# Patient Record
Sex: Male | Born: 1965 | Race: White | Hispanic: No
Health system: Southern US, Community
[De-identification: ages and names within clinical notes are randomized; demographics above are authoritative.]

## PROBLEM LIST (undated history)

## (undated) HISTORY — PX: LITHOTRIPSY: SUR834

---

## 2004-11-01 ENCOUNTER — Ambulatory Visit: Payer: Self-pay | Admitting: Internal Medicine

## 2004-12-13 ENCOUNTER — Ambulatory Visit: Payer: Self-pay | Admitting: Internal Medicine

## 2015-12-04 ENCOUNTER — Emergency Department (HOSPITAL_COMMUNITY): Payer: Medicaid Other

## 2015-12-04 ENCOUNTER — Observation Stay (HOSPITAL_COMMUNITY)
Admission: EM | Admit: 2015-12-04 | Discharge: 2015-12-06 | Disposition: A | Discharge status: Home / Self Care | Payer: Medicaid Other | Attending: Surgery | Admitting: Surgery

## 2015-12-04 ENCOUNTER — Encounter (HOSPITAL_COMMUNITY): Payer: Self-pay | Admitting: Family Medicine

## 2015-12-04 DIAGNOSIS — K8000 Calculus of gallbladder with acute cholecystitis without obstruction: Principal | ICD-10-CM | POA: Insufficient documentation

## 2015-12-04 DIAGNOSIS — Z87442 Personal history of urinary calculi: Secondary | ICD-10-CM | POA: Diagnosis not present

## 2015-12-04 DIAGNOSIS — R1084 Generalized abdominal pain: Secondary | ICD-10-CM | POA: Diagnosis present

## 2015-12-04 DIAGNOSIS — R739 Hyperglycemia, unspecified: Secondary | ICD-10-CM | POA: Diagnosis not present

## 2015-12-04 DIAGNOSIS — F17200 Nicotine dependence, unspecified, uncomplicated: Secondary | ICD-10-CM | POA: Diagnosis not present

## 2015-12-04 DIAGNOSIS — K8010 Calculus of gallbladder with chronic cholecystitis without obstruction: Secondary | ICD-10-CM

## 2015-12-04 DIAGNOSIS — D729 Disorder of white blood cells, unspecified: Secondary | ICD-10-CM

## 2015-12-04 DIAGNOSIS — R109 Unspecified abdominal pain: Secondary | ICD-10-CM

## 2015-12-04 DIAGNOSIS — N200 Calculus of kidney: Secondary | ICD-10-CM

## 2015-12-04 DIAGNOSIS — R112 Nausea with vomiting, unspecified: Secondary | ICD-10-CM

## 2015-12-04 DIAGNOSIS — N400 Enlarged prostate without lower urinary tract symptoms: Secondary | ICD-10-CM | POA: Insufficient documentation

## 2015-12-04 LAB — URINALYSIS, ROUTINE W REFLEX MICROSCOPIC
BILIRUBIN URINE: NEGATIVE
GLUCOSE, UA: NEGATIVE mg/dL
Hgb urine dipstick: NEGATIVE
KETONES UR: 15 mg/dL — AB
Leukocytes, UA: NEGATIVE
NITRITE: NEGATIVE
PH: 6.5 (ref 5.0–8.0)
Protein, ur: NEGATIVE mg/dL
Specific Gravity, Urine: 1.01 (ref 1.005–1.030)

## 2015-12-04 LAB — DIFFERENTIAL
BASOS ABS: 0 10*3/uL (ref 0.0–0.1)
BASOS PCT: 0 %
EOS ABS: 0 10*3/uL (ref 0.0–0.7)
Eosinophils Relative: 0 %
Lymphocytes Relative: 8 %
Lymphs Abs: 1.1 10*3/uL (ref 0.7–4.0)
MONO ABS: 0.3 10*3/uL (ref 0.1–1.0)
MONOS PCT: 2 %
Neutro Abs: 12.6 10*3/uL — ABNORMAL HIGH (ref 1.7–7.7)
Neutrophils Relative %: 90 %

## 2015-12-04 LAB — COMPREHENSIVE METABOLIC PANEL
ALBUMIN: 3.9 g/dL (ref 3.5–5.0)
ALK PHOS: 89 U/L (ref 38–126)
ALT: 13 U/L — AB (ref 17–63)
AST: 21 U/L (ref 15–41)
Anion gap: 11 (ref 5–15)
BILIRUBIN TOTAL: 0.3 mg/dL (ref 0.3–1.2)
BUN: 7 mg/dL (ref 6–20)
CO2: 23 mmol/L (ref 22–32)
CREATININE: 0.66 mg/dL (ref 0.61–1.24)
Calcium: 9 mg/dL (ref 8.9–10.3)
Chloride: 106 mmol/L (ref 101–111)
GFR calc Af Amer: >60 mL/min (ref >60)
GFR calc non Af Amer: >60 mL/min (ref >60)
GLUCOSE: 172 mg/dL — AB (ref 65–99)
POTASSIUM: 4.1 mmol/L (ref 3.5–5.1)
Sodium: 140 mmol/L (ref 135–145)
TOTAL PROTEIN: 6.6 g/dL (ref 6.5–8.1)

## 2015-12-04 LAB — CBC
HEMATOCRIT: 49.9 % (ref 39.0–52.0)
Hemoglobin: 16.8 g/dL (ref 13.0–17.0)
MCH: 31.2 pg (ref 26.0–34.0)
MCHC: 33.7 g/dL (ref 30.0–36.0)
MCV: 92.8 fL (ref 78.0–100.0)
Platelets: 197 10*3/uL (ref 150–400)
RBC: 5.38 MIL/uL (ref 4.22–5.81)
RDW: 12.4 % (ref 11.5–15.5)
WBC: 14.3 10*3/uL — ABNORMAL HIGH (ref 4.0–10.5)

## 2015-12-04 LAB — SURGICAL PCR SCREEN
MRSA, PCR: NEGATIVE
Staphylococcus aureus: NEGATIVE

## 2015-12-04 LAB — LIPASE, BLOOD: Lipase: 21 U/L (ref 11–51)

## 2015-12-04 MED ORDER — DIPHENHYDRAMINE HCL 25 MG PO CAPS: 25.0000 mg | ORAL_CAPSULE | Freq: Four times a day (QID) | ORAL | Status: DC | PRN

## 2015-12-04 MED ORDER — OXYCODONE-ACETAMINOPHEN 5-325 MG PO TABS
1.0000 | ORAL_TABLET | ORAL | Status: DC | PRN
  Administered 2015-12-05 – 2015-12-06 (×3): 2 via ORAL
  Filled 2015-12-04 (×3): qty 2

## 2015-12-04 MED ORDER — FENTANYL CITRATE (PF) 100 MCG/2ML IJ SOLN
INTRAMUSCULAR | Status: AC
  Filled 2015-12-04: qty 2

## 2015-12-04 MED ORDER — HYDROMORPHONE HCL 1 MG/ML IJ SOLN
1.0000 mg | Freq: Once | INTRAMUSCULAR | Status: AC
  Administered 2015-12-04: 1 mg via INTRAVENOUS
  Filled 2015-12-04: qty 1

## 2015-12-04 MED ORDER — ONDANSETRON 4 MG PO TBDP: 4.0000 mg | ORAL_TABLET | Freq: Four times a day (QID) | ORAL | Status: DC | PRN

## 2015-12-04 MED ORDER — ACETAMINOPHEN 650 MG RE SUPP: 650.0000 mg | Freq: Four times a day (QID) | RECTAL | Status: DC | PRN

## 2015-12-04 MED ORDER — ONDANSETRON HCL 4 MG/2ML IJ SOLN: 4.0000 mg | Freq: Four times a day (QID) | INTRAMUSCULAR | Status: DC | PRN

## 2015-12-04 MED ORDER — NICOTINE 14 MG/24HR TD PT24
14.0000 mg | MEDICATED_PATCH | Freq: Every day | TRANSDERMAL | Status: DC
  Administered 2015-12-06: 14 mg via TRANSDERMAL
  Filled 2015-12-04 (×2): qty 1

## 2015-12-04 MED ORDER — FENTANYL CITRATE (PF) 100 MCG/2ML IJ SOLN
50.0000 ug | Freq: Once | INTRAMUSCULAR | Status: AC
Start: 2015-12-04 — End: 2015-12-04
  Administered 2015-12-04: 50 ug via INTRAVENOUS

## 2015-12-04 MED ORDER — ACETAMINOPHEN 325 MG PO TABS: 650.0000 mg | ORAL_TABLET | Freq: Four times a day (QID) | ORAL | Status: DC | PRN

## 2015-12-04 MED ORDER — DEXTROSE 5 % IV SOLN
2.0000 g | Freq: Once | INTRAVENOUS | Status: DC
  Filled 2015-12-04: qty 2

## 2015-12-04 MED ORDER — ONDANSETRON 4 MG PO TBDP
ORAL_TABLET | ORAL | Status: AC
  Filled 2015-12-04: qty 1

## 2015-12-04 MED ORDER — DEXTROSE 5 % IV SOLN
2.0000 g | INTRAVENOUS | Status: DC
  Administered 2015-12-04 – 2015-12-05 (×2): 2 g via INTRAVENOUS
  Filled 2015-12-04 (×2): qty 2

## 2015-12-04 MED ORDER — DEXTROSE-NACL 5-0.9 % IV SOLN
INTRAVENOUS | Status: DC
  Administered 2015-12-04 – 2015-12-05 (×2): via INTRAVENOUS

## 2015-12-04 MED ORDER — OXYCODONE-ACETAMINOPHEN 5-325 MG PO TABS
1.0000 | ORAL_TABLET | Freq: Once | ORAL | Status: DC
Start: 2015-12-04 — End: 2015-12-04

## 2015-12-04 MED ORDER — IOHEXOL 300 MG/ML  SOLN
100.0000 mL | Freq: Once | INTRAMUSCULAR | Status: AC | PRN
  Administered 2015-12-04: 100 mL via INTRAVENOUS

## 2015-12-04 MED ORDER — MORPHINE SULFATE (PF) 2 MG/ML IV SOLN
4.0000 mg | Freq: Once | INTRAVENOUS | Status: AC
  Administered 2015-12-04: 4 mg via INTRAVENOUS
  Filled 2015-12-04: qty 2

## 2015-12-04 MED ORDER — ONDANSETRON 4 MG PO TBDP
4.0000 mg | ORAL_TABLET | Freq: Once | ORAL | Status: AC | PRN
  Administered 2015-12-04: 4 mg via ORAL

## 2015-12-04 MED ORDER — SODIUM CHLORIDE 0.9 % IV BOLUS (SEPSIS)
1000.0000 mL | Freq: Once | INTRAVENOUS | Status: AC
  Administered 2015-12-04: 1000 mL via INTRAVENOUS

## 2015-12-04 MED ORDER — ENOXAPARIN SODIUM 40 MG/0.4ML ~~LOC~~ SOLN
40.0000 mg | SUBCUTANEOUS | Status: DC
  Administered 2015-12-04 – 2015-12-05 (×2): 40 mg via SUBCUTANEOUS
  Filled 2015-12-04 (×2): qty 0.4

## 2015-12-04 MED ORDER — METOPROLOL TARTRATE 1 MG/ML IV SOLN: 5.0000 mg | Freq: Four times a day (QID) | INTRAVENOUS | Status: DC | PRN

## 2015-12-04 MED ORDER — PROMETHAZINE HCL 25 MG/ML IJ SOLN
25.0000 mg | Freq: Once | INTRAMUSCULAR | Status: AC
  Administered 2015-12-04: 25 mg via INTRAVENOUS
  Filled 2015-12-04: qty 1

## 2015-12-04 MED ORDER — DIPHENHYDRAMINE HCL 50 MG/ML IJ SOLN: 25.0000 mg | Freq: Four times a day (QID) | INTRAMUSCULAR | Status: DC | PRN

## 2015-12-04 MED ORDER — FENTANYL CITRATE (PF) 100 MCG/2ML IJ SOLN
50.0000 ug | Freq: Once | INTRAMUSCULAR | Status: AC
  Administered 2015-12-04: 50 ug via INTRAVENOUS

## 2015-12-04 MED ORDER — ONDANSETRON HCL 4 MG/2ML IJ SOLN
4.0000 mg | Freq: Once | INTRAMUSCULAR | Status: AC | PRN
  Administered 2015-12-04: 4 mg via INTRAVENOUS
  Filled 2015-12-04: qty 2

## 2015-12-04 MED ORDER — HYDROMORPHONE HCL 1 MG/ML IJ SOLN
1.0000 mg | INTRAMUSCULAR | Status: DC | PRN
  Administered 2015-12-04 – 2015-12-05 (×6): 1 mg via INTRAVENOUS
  Filled 2015-12-04 (×6): qty 1

## 2015-12-04 NOTE — ED Notes (Signed)
Attempted report 

## 2015-12-04 NOTE — ED Provider Notes (Signed)
CSN: [id number]     Arrival date & time 12/04/15  1045 History   First MD Initiated Contact with Patient 12/04/15 1339     Chief Complaint  Patient presents with  . Abdominal Pain     (Consider location/radiation/quality/duration/timing/severity/associated sxs/prior Treatment) HPI Comments: Dennis Waters is a 50 y.o. male with a PMHx of nephrolithiasis s/p lithotripsy, BPH, and cholelithiasis seen on CT imaging at [facility] last year, who presents to the ED with complaints of sudden onset gradually worsening abdominal pain that began around midnight last night approximately 13 hours prior to evaluation. He states the abdominal pain started somewhat in the periumbilical area/more generalized, but then localized to the right lower and upper quadrants. He describes the pain is 9/10 constant stabbing in the right lower and upper quadrants radiating into the left lower quadrant, worse with sitting, and unrelieved with fentanyl and  morphine given in triage. Associated symptoms include nausea, 4 episodes of nonbloody nonbilious emesis, and belching. He denies any fevers, chills, chest pain, shortness breath, melena, hematochezia, diarrhea, constipation, obstipation, hematemesis, dysuria, hematuria, flank pain, numbness, tingling, or focal weakness. He denies any recent travel, sick contacts, suspicious food intake, alcohol use, chronic NSAID use, or prior abdominal surgeries. His care for the prior kidney stones was at [facility] ER. States this doesn't feel like his prior kidney stones.   Patient is a 50 y.o. male presenting with abdominal pain. The history is provided by the patient. No language interpreter was used.  Abdominal Pain Pain location:  RLQ and RUQ (initially periumbilical/generalized, then localized to RLQ/RUQ) Pain quality: stabbing   Pain radiates to:  LLQ Pain severity:  Severe Onset quality:  Sudden Duration:  13 hours Timing:  Constant Progression:   Worsening Chronicity:  New Context: not previous surgeries, not recent travel, not sick contacts and not suspicious food intake   Relieved by:  Nothing Exacerbated by: sitting. Ineffective treatments: fentanyl, morphine, and zofran. Associated symptoms: belching, nausea and vomiting   Associated symptoms: no chest pain, no chills, no constipation, no diarrhea, no dysuria, no fever, no flatus, no hematemesis, no hematuria, no melena and no shortness of breath   Risk factors: no alcohol abuse, has not had multiple surgeries and no NSAID use     History reviewed. No pertinent past medical history. Past Surgical History  Procedure Laterality Date  . Lithotripsy     History reviewed. No pertinent family history. Social History  Substance Use Topics  . Smoking status: Current Every Day Smoker  . Smokeless tobacco: None  . Alcohol Use: None     Comment: occ    Review of Systems  Constitutional: Negative for fever and chills.  Respiratory: Negative for shortness of breath.   Cardiovascular: Negative for chest pain.  Gastrointestinal: Positive for nausea, vomiting and abdominal pain. Negative for diarrhea, constipation, blood in stool, melena, anal bleeding, flatus and hematemesis.  Genitourinary: Negative for dysuria, hematuria and flank pain.  Musculoskeletal: Negative for myalgias and arthralgias.  Skin: Negative for color change.  Allergic/Immunologic: Negative for immunocompromised state.  Neurological: Negative for weakness and numbness.  Psychiatric/Behavioral: Negative for confusion.   10 Systems reviewed and are negative for acute change except as noted in the HPI.    Allergies  Review of patient's allergies indicates no known allergies.  Home Medications   Prior to Admission medications   Not on File   BP 144/86 mmHg  Pulse 62  Temp(Src) 97.6 F (36.4 C) (Oral)  Resp  18  Ht 5\' 11"  (1.803 m)  Wt 77.111 kg  BMI 23.72 kg/m2  SpO2 98% Physical Exam    Constitutional: He is oriented to person, place, and time. Vital signs are normal. He appears well-developed and well-nourished.  Non-toxic appearance. No distress.  Afebrile, nontoxic, NAD  HENT:  Head: Normocephalic and atraumatic.  Mouth/Throat: Oropharynx is clear and moist and mucous membranes are normal.  Eyes: Conjunctivae and EOM are normal. Right eye exhibits no discharge. Left eye exhibits no discharge.  Neck: Normal range of motion. Neck supple.  Cardiovascular: Normal rate, regular rhythm, normal heart sounds and intact distal pulses.  Exam reveals no gallop and no friction rub.   No murmur heard. Pulmonary/Chest: Effort normal and breath sounds normal. No respiratory distress. He has no decreased breath sounds. He has no wheezes. He has no rhonchi. He has no rales.  Abdominal: Soft. Normal appearance and bowel sounds are normal. He exhibits no distension. There is tenderness in the right upper quadrant and right lower quadrant. There is guarding and tenderness at McBurney's point. There is no rigidity, no rebound, no CVA tenderness and negative Murphy's sign (painful palpation of RUQ during inspiration but able to fully inspire).    Soft, nondistended, +BS throughout, with mild RUQ and moderate RLQ TTP at mcburney's point with some slight voluntary guarding with palpation of RLQ, no rebound or rigidity, neg murphy's although painful palpation of RUQ during inspiration but able to fully inspire, no CVA TTP   Musculoskeletal: Normal range of motion.  Neurological: He is alert and oriented to person, place, and time. He has normal strength. No sensory deficit.  Skin: Skin is warm, dry and intact. No rash noted.  Psychiatric: He has a normal mood and affect.  Nursing note and vitals reviewed.   ED Course  Procedures (including critical care time) Labs Review Labs Reviewed  COMPREHENSIVE METABOLIC PANEL - Abnormal; Notable for the following:    Glucose, Bld 172 (*)    ALT 13 (*)     All other components within normal limits  CBC - Abnormal; Notable for the following:    WBC 14.3 (*)    All other components within normal limits  DIFFERENTIAL - Abnormal; Notable for the following:    Neutro Abs 12.6 (*)    All other components within normal limits  LIPASE, BLOOD  URINALYSIS, ROUTINE W REFLEX MICROSCOPIC (NOT AT [facility])    Imaging Review Ct Abdomen Pelvis W Contrast  12/04/2015  CLINICAL DATA:  RIGHT lower quadrant abdominal pain with slight guarding, RIGHT upper quadrant tenderness, smoker EXAM: CT ABDOMEN AND PELVIS WITH CONTRAST TECHNIQUE: Multidetector CT imaging of the abdomen and pelvis was performed using the standard protocol following bolus administration of intravenous contrast. Sagittal and coronal MPR images reconstructed from axial data set. CONTRAST:  OMNIPAQUE IOHEXOL 300 MG/ML SOLN IV. No oral contrast administered. COMPARISON:  01/09/2015 FINDINGS: Bibasilar atelectasis. Multiple gallstones in gallbladder up to 2.6 cm diameter. Minimal gallbladder wall thickening and pericholecystic infiltration question acute cholecystitis. Smaller gallstone 14 mm diameter seen at gallbladder neck. No biliary dilatation. 17 x 18 mm cyst laterally RIGHT lobe liver image 15. Additional tiny probable cyst lateral segment LEFT lobe liver image 14. Nonobstructing BILATERAL renal calculi. 8 mm LEFT renal cyst image 30. Liver, spleen, pancreas, kidneys, and adrenal glands otherwise normal. Normal appendix. Minimal prostatic enlargement. Dependent 6 mm calculus within urinary bladder. Ureters unremarkable. Scattered atherosclerotic calcification. Stomach and bowel loops unremarkable for technique. No mass, adenopathy, free air  or free fluid. Bones unremarkable. IMPRESSION: Multiple gallstones up to 2.6 cm diameter with an additional 14 mm stone at gallbladder neck associated with minimal gallbladder wall thickening and pericholecystic infiltration, raising question of acute  cholecystitis. BILATERAL nonobstructing renal calculi. 6 mm bladder calculus. Electronically Signed   By: Ulyses Southward M.D.   On: 12/04/2015 14:51   I have personally reviewed and evaluated these images and lab results as part of my medical decision-making.   EKG Interpretation None      MDM   Final diagnoses:  Right sided abdominal pain  Nausea and vomiting in adult  Hyperglycemia, unspecified  Neutrophilic leukocytosis  Cholecystitis with cholelithiasis  Nephrolithiasis  BPH (benign prostatic hyperplasia)    50 y.o. male here with sudden onset periumbilical/generalized abd pain that became more focal in the RLQ/RUQ, with associated N/V. Hx of nephrolithiasis and at one point had gallstones seen on a CT scan at [facility]. Denies flank pain. On exam, mildly tender in the RUQ but more moderately tender in RLQ at mcburney's point with some guarding, no rigidity or rebound pain, no flank tenderness. Murphy's not convincing, painful but able to fully inspire. Labs reveal: lipase WNL, CMP with mildly elevated gluc 172 but otherwise WNL, and CBC with WBC 14.3, will add-on differential. awaiting U/A. Will get CT abd/pelvis to r/o appy vs diverticulitis vs nephrolithiasis vs cholelithiasis vs obstruction vs other etiologies. Was given fentanyl, morphine, and zofran in triage which has not alleviated symptoms, will order dilaudid and phenergan. Will start fluids and reassess shortly. Of note, NCDB reviewed and reveals only one rx for narcotics in the last 26yr, which was in April 2016 for oxycodone  tablets #12 from Dr. Geanie Logan who is an ER doctor in [facility]. No other narcotics found in the database.  2:56 PM Differential showing neutrophilic predominance. U/A not yet obtained, will have this obtained shortly. CT showing multiple gallstones in gallbladder, up to 2.6cm in diameter, with 14mm stone in gallbladder neck, mild gallbladder wall thickening and pericholecystic fluid concerning  for acute cholecystitis; given abd tenderness and these findings, this is likely the etiology of symptoms. Also shows b/l renal calculi with 6mm bladder calculus, so he potentially could have also passed a kidney stone into the bladder recently, which could cause his symptoms. Pt still having pain, slightly improved after dilaudid but requesting more pain meds. Nausea better. Will consult surgery; will start abx for cholecystitis  3:11 PM Emina Riebock PA-C for [facility] returning page, will admit for cholecystectomy. No further orders requested at this time. Please see her notes for further documentation of care. Pt providing urine sample, but not yet in process, so [facility] will need to f/up with this result. Discussed case with my attending Dr. Silverio Lay who agrees with plan. Pt stable at this time.  BP 154/94 mmHg  Pulse 71  Temp(Src) 97.6 F (36.4 C) (Oral)  Resp 18  Ht  (1.803 m)  Wt 77.111 kg  BMI 23.72 kg/m2  SpO2 93%  Meds ordered this encounter  Medications  . ondansetron (ZOFRAN-ODT) disintegrating tablet 4 mg    Sig:   . fentaNYL (SUBLIMAZE) injection 50 mcg    Sig:   . ondansetron (ZOFRAN-ODT) 4 MG disintegrating tablet    Sig:     Bast, Traci   : cabinet override  . fentaNYL (SUBLIMAZE) 100 MCG/2ML injection    Sig:     Bast, Traci   : cabinet override  . fentaNYL (SUBLIMAZE) injection 50 mcg  Sig:   . morphine 2 MG/ML injection 4 mg    Sig:   . ondansetron (ZOFRAN) injection 4 mg    Sig:   . HYDROmorphone (DILAUDID) injection 1 mg    Sig:   . promethazine (PHENERGAN) injection 25 mg    Sig:   . sodium chloride 0.9 % bolus 1,000 mL    Sig:   . iohexol (OMNIPAQUE) 300 MG/ML solution 100 mL    Sig:   . HYDROmorphone (DILAUDID) injection 1 mg    Sig:   . cefTRIAXone (ROCEPHIN) 2 g in dextrose 5 % 50 mL IVPB    Sig:     Order Specific Question:  Antibiotic Indication:    Answer:  Intra-abdominal       Chandon Lazcano Camprubi-Soms, PA-C 12/04/15 1514  Richardean Canalavid H Yao,  MD 12/04/15 1544

## 2015-12-04 NOTE — H&P (Signed)
Chief Complaint: abdominal pain HPI: Dennis Waters is a 50 year old male with a history of kidney stones and tobacco use who presents with abdominal pain.  Started suddenly last night around 1030PM after eating chinese fried shrimp.  Initially started with nausea and then developed RUQ abdominal pain.  Associated with nausea and vomiting.  Denies fever, chills or sweats.  Previous symptoms 3 days ago after eating a tater tot casserole.  Aggravated by fatty foods.  No alleviating factors.  No modifying factors.  Location is RUQ and without radiation.   ED work up reveals, WBC 14.3k, normal LFTs.  CT of abdomen and pelvis reveals multiple gallstones measuring up 2.6cm, 35m stone in the neck of the gallbladder with minimal wall thickening and pericholecystic fluid.  We have therefore been asked to evaluate.   History reviewed. No pertinent past medical history.  Past Surgical History  Procedure Laterality Date  . Lithotripsy      History reviewed. No pertinent family history. Social History:  reports that he has been smoking.  He does not have any smokeless tobacco history on file. He reports that he drinks alcohol. He reports that he does not use illicit drugs.  Allergies: No Known Allergies   (Not in a hospital admission)  Results for orders placed or performed during the hospital encounter of 12/04/15 (from the past 48 hour(s))  Lipase, blood     Status: None   Collection Time: 12/04/15 11:45 AM  Result Value Ref Range   Lipase 21 11 - 51 U/L  Comprehensive metabolic panel     Status: Abnormal   Collection Time: 12/04/15 11:45 AM  Result Value Ref Range   Sodium 140 135 - 145 mmol/L   Potassium 4.1 3.5 - 5.1 mmol/L   Chloride 106 101 - 111 mmol/L   CO2 23 22 - 32 mmol/L   Glucose, Bld 172 (H) 65 - 99 mg/dL   BUN 7 6 - 20 mg/dL   Creatinine, Ser 0.66 0.61 - 1.24 mg/dL   Calcium 9.0 8.9 - 10.3 mg/dL   Total Protein 6.6 6.5 - 8.1 g/dL   Albumin 3.9 3.5 - 5.0 g/dL   AST 21 15 - 41 U/L    ALT 13 (L) 17 - 63 U/L   Alkaline Phosphatase 89 38 - 126 U/L   Total Bilirubin 0.3 0.3 - 1.2 mg/dL   GFR calc non Af Amer >60 >60 mL/min   GFR calc Af Amer >60 >60 mL/min    Comment: (NOTE) The eGFR has been calculated using the CKD EPI equation. This calculation has not been validated in all clinical situations. eGFR's persistently <60 mL/min signify possible Chronic Kidney Disease.    Anion gap 11 5 - 15  CBC     Status: Abnormal   Collection Time: 12/04/15 11:45 AM  Result Value Ref Range   WBC 14.3 (H) 4.0 - 10.5 K/uL   RBC 5.38 4.22 - 5.81 MIL/uL   Hemoglobin 16.8 13.0 - 17.0 g/dL   HCT 49.9 39.0 - 52.0 %   MCV 92.8 78.0 - 100.0 fL   MCH 31.2 26.0 - 34.0 pg   MCHC 33.7 30.0 - 36.0 g/dL   RDW 12.4 11.5 - 15.5 %   Platelets 197 150 - 400 K/uL  Differential     Status: Abnormal   Collection Time: 12/04/15  1:57 PM  Result Value Ref Range   Neutrophils Relative % 90 %   Neutro Abs 12.6 (H) 1.7 - 7.7 K/uL  Lymphocytes Relative 8 %   Lymphs Abs 1.1 0.7 - 4.0 K/uL   Monocytes Relative 2 %   Monocytes Absolute 0.3 0.1 - 1.0 K/uL   Eosinophils Relative 0 %   Eosinophils Absolute 0.0 0.0 - 0.7 K/uL   Basophils Relative 0 %   Basophils Absolute 0.0 0.0 - 0.1 K/uL   Ct Abdomen Pelvis W Contrast  12/04/2015  CLINICAL DATA:  RIGHT lower quadrant abdominal pain with slight guarding, RIGHT upper quadrant tenderness, smoker EXAM: CT ABDOMEN AND PELVIS WITH CONTRAST TECHNIQUE: Multidetector CT imaging of the abdomen and pelvis was performed using the standard protocol following bolus administration of intravenous contrast. Sagittal and coronal MPR images reconstructed from axial data set. CONTRAST:  194m OMNIPAQUE IOHEXOL 300 MG/ML SOLN IV. No oral contrast administered. COMPARISON:  01/09/2015 FINDINGS: Bibasilar atelectasis. Multiple gallstones in gallbladder up to 2.6 cm diameter. Minimal gallbladder wall thickening and pericholecystic infiltration question acute cholecystitis.  Smaller gallstone 14 mm diameter seen at gallbladder neck. No biliary dilatation. 17 x 18 mm cyst laterally RIGHT lobe liver image 15. Additional tiny probable cyst lateral segment LEFT lobe liver image 14. Nonobstructing BILATERAL renal calculi. 8 mm LEFT renal cyst image 30. Liver, spleen, pancreas, kidneys, and adrenal glands otherwise normal. Normal appendix. Minimal prostatic enlargement. Dependent 6 mm calculus within urinary bladder. Ureters unremarkable. Scattered atherosclerotic calcification. Stomach and bowel loops unremarkable for technique. No mass, adenopathy, free air or free fluid. Bones unremarkable. IMPRESSION: Multiple gallstones up to 2.6 cm diameter with an additional 14 mm stone at gallbladder neck associated with minimal gallbladder wall thickening and pericholecystic infiltration, raising question of acute cholecystitis. BILATERAL nonobstructing renal calculi. 6 mm bladder calculus. Electronically Signed   By: MLavonia DanaM.D.   On: 12/04/2015 14:51    Review of Systems  Constitutional: Negative for fever, chills, weight loss, malaise/fatigue and diaphoresis.  Eyes: Negative for blurred vision, double vision, photophobia, pain and discharge.  Respiratory: Negative for cough, hemoptysis, sputum production, shortness of breath and wheezing.   Cardiovascular: Negative for chest pain, palpitations, orthopnea, claudication, leg swelling and PND.  Gastrointestinal: Positive for nausea, vomiting and abdominal pain. Negative for heartburn, diarrhea, constipation, blood in stool and melena.  Genitourinary: Negative for dysuria, urgency, frequency, hematuria and flank pain.  Musculoskeletal: Negative for myalgias, back pain, joint pain, falls and neck pain.  Neurological: Positive for headaches. Negative for dizziness, tingling, tremors, sensory change, speech change, focal weakness, seizures and weakness.  Psychiatric/Behavioral: Negative for substance abuse.    Blood pressure 154/94,  pulse 71, temperature 97.6 F (36.4 C), temperature source Oral, resp. rate 18, height 5' 11"  (1.803 m), weight 77.111 kg (170 lb), SpO2 93 %. Physical Exam  Constitutional: He is oriented to person, place, and time. He appears well-developed and well-nourished. No distress.  HENT:  Head: Normocephalic and atraumatic.  Mouth/Throat: No oropharyngeal exudate.  Cardiovascular: Normal rate, regular rhythm, normal heart sounds and intact distal pulses.  Exam reveals no gallop and no friction rub.   No murmur heard. Respiratory: Effort normal and breath sounds normal. No respiratory distress. He has no wheezes. He has no rales. He exhibits no tenderness.  GI: Soft. Bowel sounds are normal. He exhibits no distension and no mass. There is no rebound and no guarding.  Minimal TTP RUQ without guarding(morphine given 30 mins ago)  Musculoskeletal: Normal range of motion. He exhibits no edema or tenderness.  Neurological: He is alert and oriented to person, place, and time.  Skin: Skin is warm and  dry. No rash noted. He is not diaphoretic. No erythema. No pallor.  Psychiatric: He has a normal mood and affect. His behavior is normal. Judgment and thought content normal.     Assessment/Plan Acute calculous cholecystitis-admit for IV antibiotics and pain control.  Laparoscopic cholecystectomy with IOC in AM.  ID-rocephin FEN-clears, NPO after MN, IVF Nicotine dependence-patch Elevated blood pressure-pain related versus undiagnosed HTN.  PRN metoprolol  VTE prophylaxis-SCD/lovenox Dispo-to floor    Erby Pian, NP 12/04/2015, 3:33 PM

## 2015-12-04 NOTE — ED Notes (Signed)
Pt here for abd pain more in the RLQ. sts N,V.

## 2015-12-05 ENCOUNTER — Observation Stay (HOSPITAL_COMMUNITY): Payer: Medicaid Other | Admitting: Anesthesiology

## 2015-12-05 ENCOUNTER — Observation Stay (HOSPITAL_COMMUNITY): Payer: Medicaid Other

## 2015-12-05 ENCOUNTER — Encounter (HOSPITAL_COMMUNITY): Payer: Self-pay | Admitting: Certified Registered"

## 2015-12-05 ENCOUNTER — Encounter (HOSPITAL_COMMUNITY)
Admission: EM | Disposition: A | Discharge status: Home / Self Care | Payer: Self-pay | Source: Home / Self Care | Attending: Emergency Medicine

## 2015-12-05 DIAGNOSIS — F17200 Nicotine dependence, unspecified, uncomplicated: Secondary | ICD-10-CM | POA: Diagnosis not present

## 2015-12-05 DIAGNOSIS — R739 Hyperglycemia, unspecified: Secondary | ICD-10-CM | POA: Diagnosis not present

## 2015-12-05 DIAGNOSIS — N400 Enlarged prostate without lower urinary tract symptoms: Secondary | ICD-10-CM | POA: Diagnosis not present

## 2015-12-05 DIAGNOSIS — K8000 Calculus of gallbladder with acute cholecystitis without obstruction: Secondary | ICD-10-CM | POA: Diagnosis not present

## 2015-12-05 HISTORY — PX: CHOLECYSTECTOMY: SHX55

## 2015-12-05 SURGERY — LAPAROSCOPIC CHOLECYSTECTOMY WITH INTRAOPERATIVE CHOLANGIOGRAM
Anesthesia: General | Site: Abdomen

## 2015-12-05 MED ORDER — SUGAMMADEX SODIUM 200 MG/2ML IV SOLN
INTRAVENOUS | Status: DC | PRN
  Administered 2015-12-05: 200 mg via INTRAVENOUS

## 2015-12-05 MED ORDER — PROPOFOL 10 MG/ML IV BOLUS
INTRAVENOUS | Status: AC
  Filled 2015-12-05: qty 20

## 2015-12-05 MED ORDER — PHENYLEPHRINE HCL 10 MG/ML IJ SOLN
INTRAMUSCULAR | Status: DC | PRN
  Administered 2015-12-05: 120 ug via INTRAVENOUS
  Administered 2015-12-05 (×3): 80 ug via INTRAVENOUS

## 2015-12-05 MED ORDER — PROPOFOL 10 MG/ML IV BOLUS
INTRAVENOUS | Status: DC | PRN
  Administered 2015-12-05: 200 mg via INTRAVENOUS
  Administered 2015-12-05: 50 mg via INTRAVENOUS

## 2015-12-05 MED ORDER — LACTATED RINGERS IV SOLN
INTRAVENOUS | Status: DC
  Administered 2015-12-05 (×2): via INTRAVENOUS

## 2015-12-05 MED ORDER — ONDANSETRON HCL 4 MG/2ML IJ SOLN
INTRAMUSCULAR | Status: DC | PRN
  Administered 2015-12-05: 4 mg via INTRAVENOUS

## 2015-12-05 MED ORDER — ALBUTEROL SULFATE HFA 108 (90 BASE) MCG/ACT IN AERS
INHALATION_SPRAY | RESPIRATORY_TRACT | Status: DC | PRN
  Administered 2015-12-05 (×2): 4 via RESPIRATORY_TRACT

## 2015-12-05 MED ORDER — FENTANYL CITRATE (PF) 250 MCG/5ML IJ SOLN
INTRAMUSCULAR | Status: DC | PRN
  Administered 2015-12-05: 50 ug via INTRAVENOUS
  Administered 2015-12-05: 100 ug via INTRAVENOUS
  Administered 2015-12-05 (×2): 50 ug via INTRAVENOUS

## 2015-12-05 MED ORDER — ATROPINE SULFATE 0.1 MG/ML IJ SOLN
INTRAMUSCULAR | Status: AC
  Filled 2015-12-05: qty 10

## 2015-12-05 MED ORDER — HEMOSTATIC AGENTS (NO CHARGE) OPTIME
TOPICAL | Status: DC | PRN
  Administered 2015-12-05 (×2): 1 via TOPICAL

## 2015-12-05 MED ORDER — FENTANYL CITRATE (PF) 250 MCG/5ML IJ SOLN
INTRAMUSCULAR | Status: AC
  Filled 2015-12-05: qty 5

## 2015-12-05 MED ORDER — SODIUM CHLORIDE 0.9 % IR SOLN
Status: DC | PRN
  Administered 2015-12-05: 1

## 2015-12-05 MED ORDER — 0.9 % SODIUM CHLORIDE (POUR BTL) OPTIME
TOPICAL | Status: DC | PRN
  Administered 2015-12-05: 1000 mL

## 2015-12-05 MED ORDER — HYDROMORPHONE HCL 1 MG/ML IJ SOLN
0.2500 mg | INTRAMUSCULAR | Status: DC | PRN
  Administered 2015-12-05 (×2): 0.5 mg via INTRAVENOUS

## 2015-12-05 MED ORDER — SUGAMMADEX SODIUM 200 MG/2ML IV SOLN
INTRAVENOUS | Status: AC
  Filled 2015-12-05: qty 2

## 2015-12-05 MED ORDER — BUPIVACAINE-EPINEPHRINE 0.25% -1:200000 IJ SOLN
INTRAMUSCULAR | Status: DC | PRN
  Administered 2015-12-05: 7 mL

## 2015-12-05 MED ORDER — MIDAZOLAM HCL 2 MG/2ML IJ SOLN
INTRAMUSCULAR | Status: AC
  Filled 2015-12-05: qty 2

## 2015-12-05 MED ORDER — BUPIVACAINE-EPINEPHRINE (PF) 0.25% -1:200000 IJ SOLN
INTRAMUSCULAR | Status: AC
  Filled 2015-12-05: qty 30

## 2015-12-05 MED ORDER — IOHEXOL 300 MG/ML  SOLN
INTRAMUSCULAR | Status: DC | PRN
  Administered 2015-12-05: 8 mL

## 2015-12-05 MED ORDER — PROMETHAZINE HCL 25 MG/ML IJ SOLN: 6.2500 mg | INTRAMUSCULAR | Status: DC | PRN

## 2015-12-05 MED ORDER — HYDROMORPHONE HCL 1 MG/ML IJ SOLN
INTRAMUSCULAR | Status: AC
  Administered 2015-12-05: 0.5 mg via INTRAVENOUS
  Filled 2015-12-05: qty 1

## 2015-12-05 MED ORDER — ROCURONIUM BROMIDE 100 MG/10ML IV SOLN
INTRAVENOUS | Status: DC | PRN
  Administered 2015-12-05: 30 mg via INTRAVENOUS
  Administered 2015-12-05: 10 mg via INTRAVENOUS

## 2015-12-05 MED ORDER — MIDAZOLAM HCL 5 MG/5ML IJ SOLN
INTRAMUSCULAR | Status: DC | PRN
  Administered 2015-12-05: 2 mg via INTRAVENOUS

## 2015-12-05 MED ORDER — ONDANSETRON HCL 4 MG/2ML IJ SOLN
INTRAMUSCULAR | Status: AC
  Filled 2015-12-05: qty 2

## 2015-12-05 MED ORDER — DEXAMETHASONE SODIUM PHOSPHATE 4 MG/ML IJ SOLN
INTRAMUSCULAR | Status: DC | PRN
  Administered 2015-12-05: 4 mg via INTRAVENOUS

## 2015-12-05 MED ORDER — LIDOCAINE HCL (CARDIAC) 20 MG/ML IV SOLN
INTRAVENOUS | Status: DC | PRN
  Administered 2015-12-05: 40 mg via INTRATRACHEAL
  Administered 2015-12-05: 60 mg via INTRAVENOUS

## 2015-12-05 SURGICAL SUPPLY — 51 items
APL SKNCLS STERI-STRIP NONHPOA (GAUZE/BANDAGES/DRESSINGS) ×1
APPLIER CLIP ROT 10 11.4 M/L (STAPLE) ×2
APR CLP MED LRG 11.4X10 (STAPLE) ×1
BAG SPEC RTRVL 10 TROC 200 (ENDOMECHANICALS) ×1
BAG SPEC RTRVL LRG 6X4 10 (ENDOMECHANICALS) ×1
BENZOIN TINCTURE PRP APPL 2/3 (GAUZE/BANDAGES/DRESSINGS) ×2 IMPLANT
BLADE SURG ROTATE 9660 (MISCELLANEOUS) IMPLANT
CANISTER SUCTION 2500CC (MISCELLANEOUS) ×2 IMPLANT
CHLORAPREP W/TINT 26ML (MISCELLANEOUS) ×2 IMPLANT
CLIP APPLIE ROT 10 11.4 M/L (STAPLE) ×1 IMPLANT
COVER MAYO STAND STRL (DRAPES) ×2 IMPLANT
COVER SURGICAL LIGHT HANDLE (MISCELLANEOUS) ×2 IMPLANT
DRAPE C-ARM 42X72 X-RAY (DRAPES) ×2 IMPLANT
DRSG TEGADERM 2-3/8X2-3/4 SM (GAUZE/BANDAGES/DRESSINGS) ×6 IMPLANT
DRSG TEGADERM 4X4.75 (GAUZE/BANDAGES/DRESSINGS) ×2 IMPLANT
ELECT REM PT RETURN 9FT ADLT (ELECTROSURGICAL) ×2
ELECTRODE REM PT RTRN 9FT ADLT (ELECTROSURGICAL) ×1 IMPLANT
FILTER SMOKE EVAC LAPAROSHD (FILTER) ×2 IMPLANT
GAUZE SPONGE 2X2 8PLY STRL LF (GAUZE/BANDAGES/DRESSINGS) ×1 IMPLANT
GLOVE BIO SURGEON STRL SZ7 (GLOVE) ×3 IMPLANT
GLOVE BIOGEL PI IND STRL 7.0 (GLOVE) IMPLANT
GLOVE BIOGEL PI IND STRL 7.5 (GLOVE) ×1 IMPLANT
GLOVE BIOGEL PI INDICATOR 7.0 (GLOVE) ×1
GLOVE BIOGEL PI INDICATOR 7.5 (GLOVE) ×2
GOWN STRL REUS W/ TWL LRG LVL3 (GOWN DISPOSABLE) ×3 IMPLANT
GOWN STRL REUS W/TWL LRG LVL3 (GOWN DISPOSABLE) ×6
HEMOSTAT SNOW SURGICEL 2X4 (HEMOSTASIS) ×2 IMPLANT
KIT BASIN OR (CUSTOM PROCEDURE TRAY) ×2 IMPLANT
KIT ROOM TURNOVER OR (KITS) ×2 IMPLANT
NS IRRIG 1000ML POUR BTL (IV SOLUTION) ×2 IMPLANT
PAD ARMBOARD 7.5X6 YLW CONV (MISCELLANEOUS) ×2 IMPLANT
POUCH RETRIEVAL ECOSAC 10 (ENDOMECHANICALS) IMPLANT
POUCH RETRIEVAL ECOSAC 10MM (ENDOMECHANICALS) ×1
POUCH SPECIMEN RETRIEVAL 10MM (ENDOMECHANICALS) ×2 IMPLANT
SCISSORS LAP 5X35 DISP (ENDOMECHANICALS) ×2 IMPLANT
SET CHOLANGIOGRAPH 5 50 .035 (SET/KITS/TRAYS/PACK) ×2 IMPLANT
SET IRRIG TUBING LAPAROSCOPIC (IRRIGATION / IRRIGATOR) ×2 IMPLANT
SLEEVE ENDOPATH XCEL 5M (ENDOMECHANICALS) ×2 IMPLANT
SPECIMEN JAR SMALL (MISCELLANEOUS) ×2 IMPLANT
SPONGE GAUZE 2X2 STER 10/PKG (GAUZE/BANDAGES/DRESSINGS) ×1
STRIP CLOSURE SKIN 1/2X4 (GAUZE/BANDAGES/DRESSINGS) ×2 IMPLANT
SUT MNCRL AB 4-0 PS2 18 (SUTURE) ×2 IMPLANT
SUT VICRYL 0 UR6 27IN ABS (SUTURE) ×1 IMPLANT
SYR BULB 3OZ (MISCELLANEOUS) ×1 IMPLANT
TOWEL OR 17X24 6PK STRL BLUE (TOWEL DISPOSABLE) ×2 IMPLANT
TOWEL OR 17X26 10 PK STRL BLUE (TOWEL DISPOSABLE) ×2 IMPLANT
TRAY LAPAROSCOPIC MC (CUSTOM PROCEDURE TRAY) ×2 IMPLANT
TROCAR XCEL BLUNT TIP 100MML (ENDOMECHANICALS) ×2 IMPLANT
TROCAR XCEL NON-BLD 11X100MML (ENDOMECHANICALS) ×2 IMPLANT
TROCAR XCEL NON-BLD 5MMX100MML (ENDOMECHANICALS) ×2 IMPLANT
TUBING INSUFFLATION (TUBING) ×2 IMPLANT

## 2015-12-05 NOTE — Anesthesia Procedure Notes (Signed)
Procedure Name: Intubation Date/Time: 12/05/2015 11:11 AM Performed by: Julian Reil Pre-anesthesia Checklist: Patient identified, Emergency Drugs available, Suction available and Patient being monitored Patient Re-evaluated:Patient Re-evaluated prior to inductionOxygen Delivery Method: Circle system utilized Preoxygenation: Pre-oxygenation with 100% oxygen Intubation Type: IV induction Ventilation: Mask ventilation without difficulty Laryngoscope Size: Mac and 4 Grade View: Grade I Tube type: Oral Tube size: 7.5 mm Number of attempts: 1 Airway Equipment and Method: LTA kit utilized Placement Confirmation: breath sounds checked- equal and bilateral,  ETT inserted through vocal cords under direct vision and positive ETCO2 Secured at: 22 cm Tube secured with: Tape Dental Injury: Teeth and Oropharynx as per pre-operative assessment

## 2015-12-05 NOTE — Anesthesia Postprocedure Evaluation (Signed)
Anesthesia Post Note  Patient: Dennis Waters  Procedure(s) Performed: Procedure(s) (LRB): LAPAROSCOPIC CHOLECYSTECTOMY WITH INTRAOPERATIVE CHOLANGIOGRAM (N/A)  Patient location during evaluation: PACU Anesthesia Type: General Level of consciousness: awake and alert Pain management: pain level controlled Vital Signs Assessment: post-procedure vital signs reviewed and stable Respiratory status: spontaneous breathing, nonlabored ventilation, respiratory function stable and patient connected to nasal cannula oxygen Cardiovascular status: blood pressure returned to baseline and stable Postop Assessment: no signs of nausea or vomiting Anesthetic complications: no    Last Vitals:  Filed Vitals:   12/05/15 1309 12/05/15 1315  BP:  109/76  Pulse:  81  Temp: 36.7 C   Resp:  22    Last Pain:  Filed Vitals:   12/05/15 1318  PainSc: 6                  Reino KentJudd, Zahari Fazzino J

## 2015-12-05 NOTE — Anesthesia Preprocedure Evaluation (Signed)
Anesthesia Evaluation  Patient identified by MRN, date of birth, ID band Patient awake    Reviewed: Allergy & Precautions, H&P , NPO status , Patient's Chart, lab work & pertinent test results  History of Anesthesia Complications Negative for: history of anesthetic complications  Airway Mallampati: II  TM Distance: >3 FB Neck ROM: full    Dental no notable dental hx.    Pulmonary neg pulmonary ROS, Current Smoker,    Pulmonary exam normal breath sounds clear to auscultation       Cardiovascular negative cardio ROS Normal cardiovascular exam Rhythm:regular Rate:Normal     Neuro/Psych negative neurological ROS     GI/Hepatic negative GI ROS, Neg liver ROS,   Endo/Other  negative endocrine ROS  Renal/GU negative Renal ROS     Musculoskeletal   Abdominal   Peds  Hematology negative hematology ROS (+)   Anesthesia Other Findings   Reproductive/Obstetrics negative OB ROS                             Anesthesia Physical Anesthesia Plan  ASA: II  Anesthesia Plan: General   Post-op Pain Management:    Induction: Intravenous  Airway Management Planned: Oral ETT  Additional Equipment:   Intra-op Plan:   Post-operative Plan: Extubation in OR  Informed Consent: I have reviewed the patients History and Physical, chart, labs and discussed the procedure including the risks, benefits and alternatives for the proposed anesthesia with the patient or authorized representative who has indicated his/her understanding and acceptance.   Dental Advisory Given  Plan Discussed with: Anesthesiologist, CRNA and Surgeon  Anesthesia Plan Comments:         Anesthesia Quick Evaluation  

## 2015-12-05 NOTE — Op Note (Signed)
Laparoscopic Cholecystectomy with IOC Procedure Note  Indications: This patient presents with symptomatic gallbladder disease and will undergo laparoscopic cholecystectomy.  Pre-operative Diagnosis: Calculus of gallbladder with acute cholecystitis, without mention of obstruction  Post-operative Diagnosis: Same  Surgeon: Renda Pohlman K.   Assistants: none  Anesthesia: General endotracheal anesthesia  ASA Class: 2E  Procedure Details  The patient was seen again in the Holding Room. The risks, benefits, complications, treatment options, and expected outcomes were discussed with the patient. The possibilities of reaction to medication, pulmonary aspiration, perforation of viscus, bleeding, recurrent infection, finding a normal gallbladder, the need for additional procedures, failure to diagnose a condition, the possible need to convert to an open procedure, and creating a complication requiring transfusion or operation were discussed with the patient. The likelihood of improving the patient's symptoms with return to their baseline status is good.  The patient and/or family concurred with the proposed plan, giving informed consent. The site of surgery properly noted. The patient was taken to Operating Room, identified as Dennis Waters and the procedure verified as Laparoscopic Cholecystectomy with Intraoperative Cholangiogram. A Time Out was held and the above information confirmed.  Prior to the induction of general anesthesia, antibiotic prophylaxis was administered. General endotracheal anesthesia was then administered and tolerated well. After the induction, the abdomen was prepped with Chloraprep and draped in the sterile fashion. The patient was positioned in the supine position.  Local anesthetic agent was injected into the skin below the umbilicus and an incision made. We dissected down to the abdominal fascia with blunt dissection.  The fascia was incised vertically and we entered the  peritoneal cavity bluntly.  A pursestring suture of 0-Vicryl was placed around the fascial opening.  The Hasson cannula was inserted and secured with the stay suture.  Pneumoperitoneum was then created with CO2 and tolerated well without any adverse changes in the patient's vital signs. An 11-mm port was placed in the subxiphoid position.  Two 5-mm ports were placed in the right upper quadrant. All skin incisions were infiltrated with a local anesthetic agent before making the incision and placing the trocars.   We positioned the patient in reverse Trendelenburg, tilted slightly to the patient's left.  The gallbladder was identified and was quite edematous and thickened.  There were significant adhesions to the gallbladder and the edge of the liver.  The liver capsule tore near the falciform ligament as we were pulling down the adhesions.  This area was cauterized for hemostasis.  The fundus was grasped and retracted cephalad. Adhesions were lysed bluntly and with the electrocautery where indicated, taking care not to injure any adjacent organs or viscus. The infundibulum was grasped and retracted laterally, exposing the peritoneum overlying the triangle of Calot. This was then divided and exposed in a blunt fashion. A critical view of the cystic duct and cystic artery was obtained.  The cystic duct was clearly identified and bluntly dissected circumferentially. The cystic duct was ligated with a clip distally.   An incision was made in the cystic duct and the [facility] chol[facility] catheter introduced. The catheter was secured using a clip. A cholangiogram was then obtained which showed good visualization of the distal and proximal biliary tree with no sign of filling defects or obstruction.  Contrast flowed easily into the duodenum. The catheter was then removed.   The cystic duct was then ligated with clips and divided. The cystic artery was identified, dissected free, ligated with clips and divided as well.    The gallbladder  was dissected from the liver bed in retrograde fashion with the electrocautery. The gallbladder was removed and placed in an Endocatch sac. The liver bed was irrigated and inspected. Hemostasis was achieved with the electrocautery and Surgicel snow. Copious irrigation was utilized and was repeatedly aspirated until clear.  The gallbladder and Endocatch sac were then removed through the umbilical port site.  We had to enlarge the fascial opening significantly to remove the gallbladder.  The fascia was closed with interrupted 0 vicryl.   We again inspected the right upper quadrant for hemostasis.  Pneumoperitoneum was released as we removed the trocars.  4-0 Monocryl was used to close the skin.   Benzoin, steri-strips, and clean dressings were applied. The patient was then extubated and brought to the recovery room in stable condition. Instrument, sponge, and needle counts were correct at closure and at the conclusion of the case.   Findings: Cholecystitis with Cholelithiasis  Estimated Blood Loss: less than 100 mL         Drains: none         Specimens: Gallbladder           Complications: None; patient tolerated the procedure well.         Disposition: PACU - hemodynamically stable.         Condition: stable  Wilmon Arms. Corliss Skains, MD, [facility]  General/ Trauma Surgery  12/05/2015 12:46 PM

## 2015-12-05 NOTE — Transfer of Care (Signed)
Immediate Anesthesia Transfer of Care Note  Patient: Dennis Waters  Procedure(s) Performed: Procedure(s): LAPAROSCOPIC CHOLECYSTECTOMY WITH INTRAOPERATIVE CHOLANGIOGRAM (N/A)  Patient Location: PACU  Anesthesia Type:General  Level of Consciousness: awake, alert , oriented and patient cooperative  Airway & Oxygen Therapy: Patient Spontanous Breathing and Patient connected to face mask oxygen  Post-op Assessment: Report given to RN, Post -op Vital signs reviewed and stable and Patient moving all extremities  Post vital signs: Reviewed and stable  Last Vitals:  Filed Vitals:   12/04/15 2038 12/05/15 0512  BP: 128/74 112/61  Pulse: 70 84  Temp: 36.7 C 37.2 C  Resp: 16 16    Complications: No apparent anesthesia complications

## 2015-12-06 ENCOUNTER — Encounter (HOSPITAL_COMMUNITY): Payer: Self-pay | Admitting: Surgery

## 2015-12-06 MED ORDER — OXYCODONE-ACETAMINOPHEN 5-325 MG PO TABS: 1.0000 | ORAL_TABLET | ORAL | Status: AC | PRN

## 2015-12-06 MED ORDER — AMOXICILLIN-POT CLAVULANATE 875-125 MG PO TABS: 1.0000 | ORAL_TABLET | Freq: Two times a day (BID) | ORAL | Status: AC

## 2015-12-06 NOTE — Discharge Summary (Signed)
Physician Discharge Summary  Patient ID: BOSTEN NEWSTROM MRN: [id number] DOB/AGE: [birth year] 50 y.o.  Admit date: 12/04/2015 Discharge date: 12/06/2015  Admitting Diagnosis: Acute calculous cholecystitis   Discharge Diagnosis Patient Active Problem List   Diagnosis Date Noted  . Acute calculous cholecystitis 12/04/2015    Consultants none  Imaging: Dg Cholangiogram Operative  12/05/2015  CLINICAL DATA:  Acute calculus cholecystitis. EXAM: INTRAOPERATIVE CHOLANGIOGRAM FLUOROSCOPY TIME:  19 seconds COMPARISON:  CT abdomen pelvis - 12/04/2015 FINDINGS: Intraoperative cholangiographic images of the right upper abdominal quadrant during laparoscopic cholecystectomy are provided for review. Surgical clips overlie the expected location of the gallbladder fossa. Contrast injection demonstrates selective cannulation of the central aspect of the cystic duct. There is passage of contrast through the central aspect of the cystic duct with filling of a non dilated common bile duct. There is passage of contrast though the CBD and into the descending portion of the duodenum. There is minimal reflux of injected contrast into the common hepatic duct and central aspect of the non dilated intrahepatic biliary system. There are no discrete filling defects within the opacified portions of the biliary system to suggest the presence of choledocholithiasis. IMPRESSION: No evidence of choledocholithiasis. Electronically Signed   By: Simonne Come M.D.   On: 12/05/2015 12:30   Ct Abdomen Pelvis W Contrast  12/04/2015  CLINICAL DATA:  RIGHT lower quadrant abdominal pain with slight guarding, RIGHT upper quadrant tenderness, smoker EXAM: CT ABDOMEN AND PELVIS WITH CONTRAST TECHNIQUE: Multidetector CT imaging of the abdomen and pelvis was performed using the standard protocol following bolus administration of intravenous contrast. Sagittal and coronal MPR images reconstructed from axial data set. CONTRAST:  OMNIPAQUE  IOHEXOL 300 MG/ML SOLN IV. No oral contrast administered. COMPARISON:  01/09/2015 FINDINGS: Bibasilar atelectasis. Multiple gallstones in gallbladder up to 2.6 cm diameter. Minimal gallbladder wall thickening and pericholecystic infiltration question acute cholecystitis. Smaller gallstone 14 mm diameter seen at gallbladder neck. No biliary dilatation. 17 x 18 mm cyst laterally RIGHT lobe liver image 15. Additional tiny probable cyst lateral segment LEFT lobe liver image 14. Nonobstructing BILATERAL renal calculi. 8 mm LEFT renal cyst image 30. Liver, spleen, pancreas, kidneys, and adrenal glands otherwise normal. Normal appendix. Minimal prostatic enlargement. Dependent 6 mm calculus within urinary bladder. Ureters unremarkable. Scattered atherosclerotic calcification. Stomach and bowel loops unremarkable for technique. No mass, adenopathy, free air or free fluid. Bones unremarkable. IMPRESSION: Multiple gallstones up to 2.6 cm diameter with an additional 14 mm stone at gallbladder neck associated with minimal gallbladder wall thickening and pericholecystic infiltration, raising question of acute cholecystitis. BILATERAL nonobstructing renal calculi. 6 mm bladder calculus. Electronically Signed   By: Ulyses Southward M.D.   On: 12/04/2015 14:51    Procedures Laparoscopic cholecystectomy with IOC--Dr. Corliss Skains   HPI: Dennis Waters is a 50 year old male with a history of kidney stones and tobacco use who presents with abdominal pain. Started suddenly last night around 1030PM after eating chinese fried shrimp. Initially started with nausea and then developed RUQ abdominal pain. Associated with nausea and vomiting. Denies fever, chills or sweats. Previous symptoms 3 days ago after eating a tater tot casserole. Aggravated by fatty foods. No alleviating factors. No modifying factors. Location is RUQ and without radiation. ED work up reveals, WBC 14.3k, normal LFTs. CT of abdomen and pelvis reveals multiple  gallstones measuring up 2.6cm, 14mm stone in the neck of the gallbladder with minimal wall thickening and pericholecystic fluid. We have therefore been asked to evaluate.  Hospital Course:  Patient was admitted and underwent procedure listed above.  Tolerated procedure well and was transferred to the floor.  Diet was advanced as tolerated. Kept on IV antibiotics.  On POD#1, the patient was voiding well, tolerating diet, ambulating well, pain well controlled, vital signs stable, incisions c/d/i and felt stable for discharge home.  Medication risks, benefits and therapeutic alternatives were reviewed with the patient.  She/he verbalizes understanding.  Patient will follow up in our office in 3 weeks and knows to call with questions or concerns.  He was discharged with additional 5 days of Augmentin.   Physical Exam: General:  Alert, NAD, pleasant, comfortable Abd:  Soft, ND, mild tenderness, incisions C/D/I    Medication List    TAKE these medications        amoxicillin-clavulanate 875-125 MG tablet  Commonly known as:  AUGMENTIN  Take 1 tablet by mouth 2 (two) times daily.     oxyCODONE-acetaminophen 5-325 MG tablet  Commonly known as:  PERCOCET/ROXICET  Take 1-2 tablets by mouth every 4 (four) hours as needed for moderate pain.             Follow-up Information    Follow up with [facility] On 12/27/2015.   Specialty:  General Surgery   Why:  arrive by 9AM for a 9:30AM post op check    Contact information:   [address] [place] [address] [phone]       Signed: Ashok Norrismina Peace Jost, [facility] [phone]  12/06/2015, 10:18 AM

## 2015-12-06 NOTE — Discharge Instructions (Signed)

## 2015-12-06 NOTE — Progress Notes (Signed)
Patient discharged home with instructions and prescriptions.

## 2017-07-10 IMAGING — RF DG CHOLANGIOGRAM OPERATIVE
1 series · 4 of 4 positions shown · non-contrast
Comparison: CT abdomen pelvis - 12/04/2015

CLINICAL DATA: Acute calculus cholecystitis.

EXAM:
INTRAOPERATIVE CHOLANGIOGRAM
FLUOROSCOPY TIME:  19 seconds

[Series 1: run · 4 of 112 frames shown]
[frame 2/112]
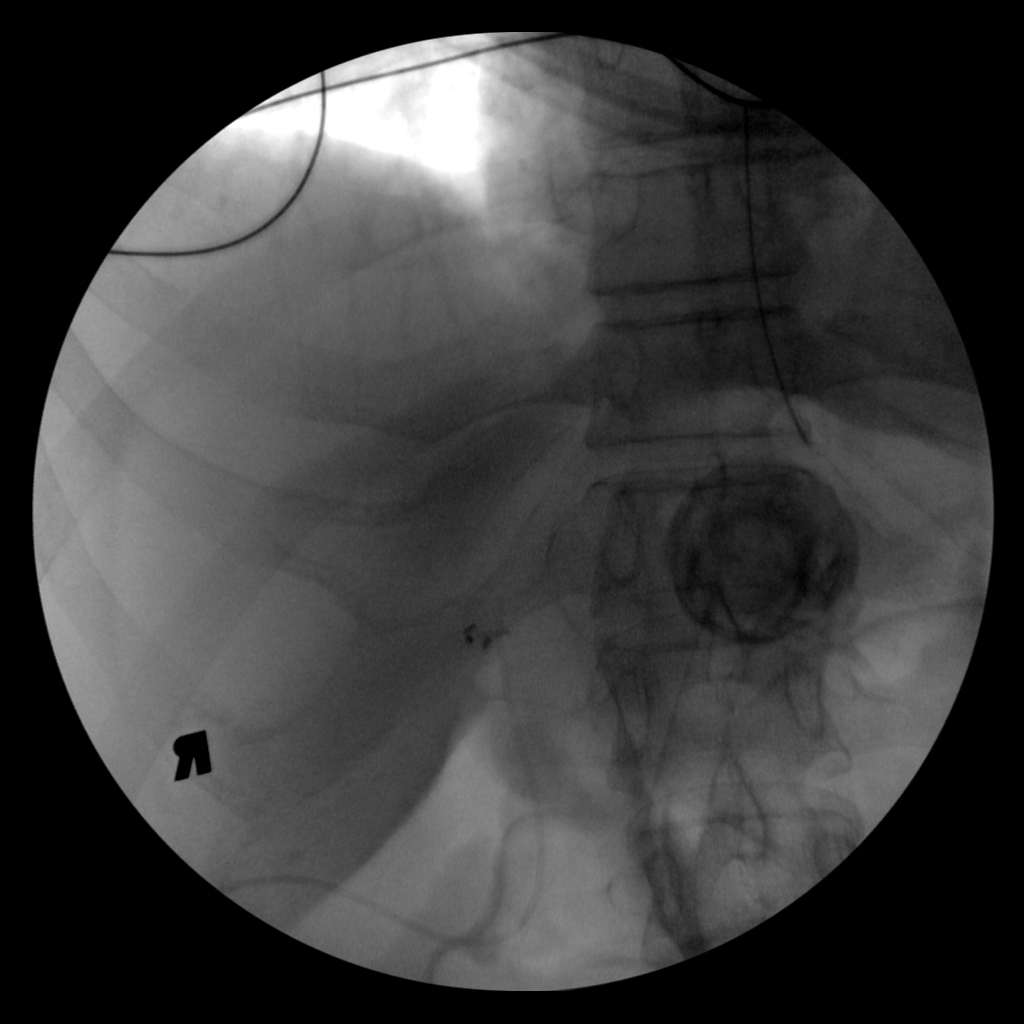
[frame 17/112]
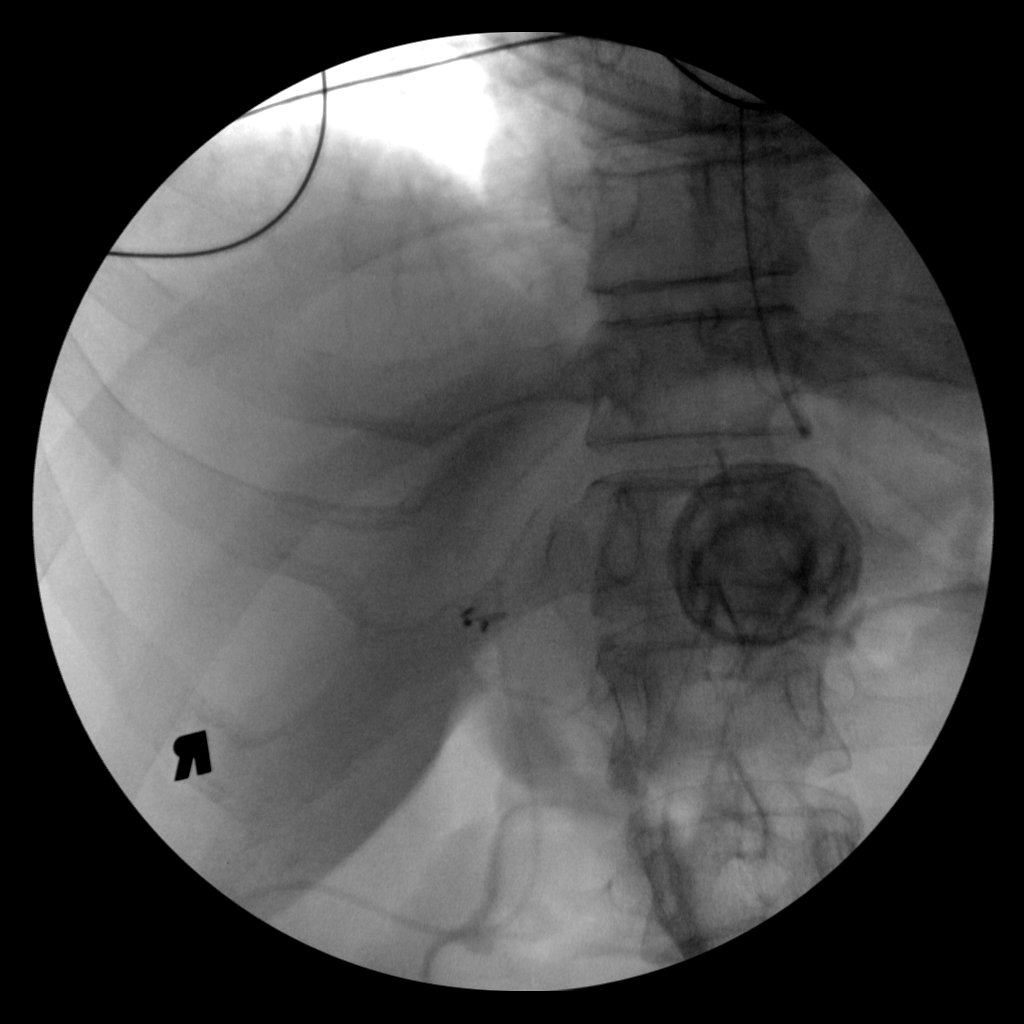
[frame 57/112]
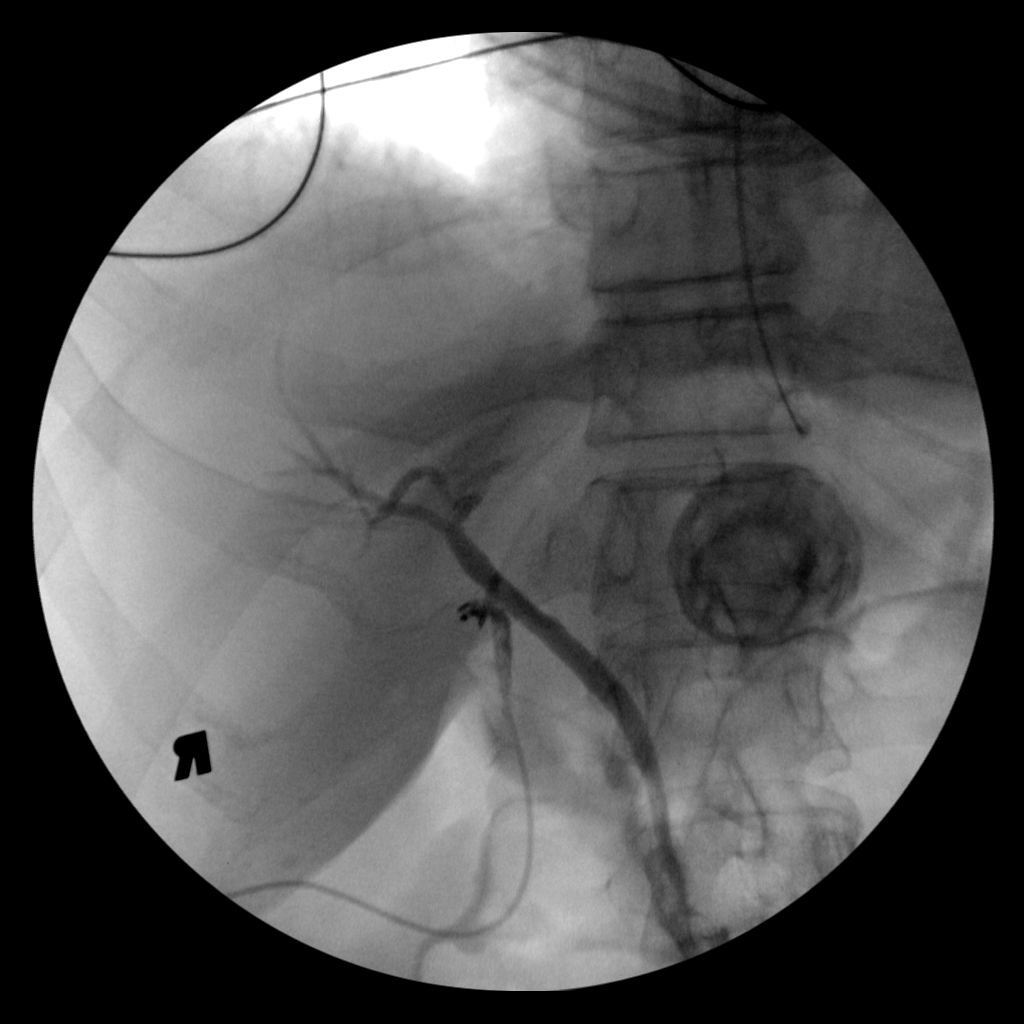
[frame 96/112]
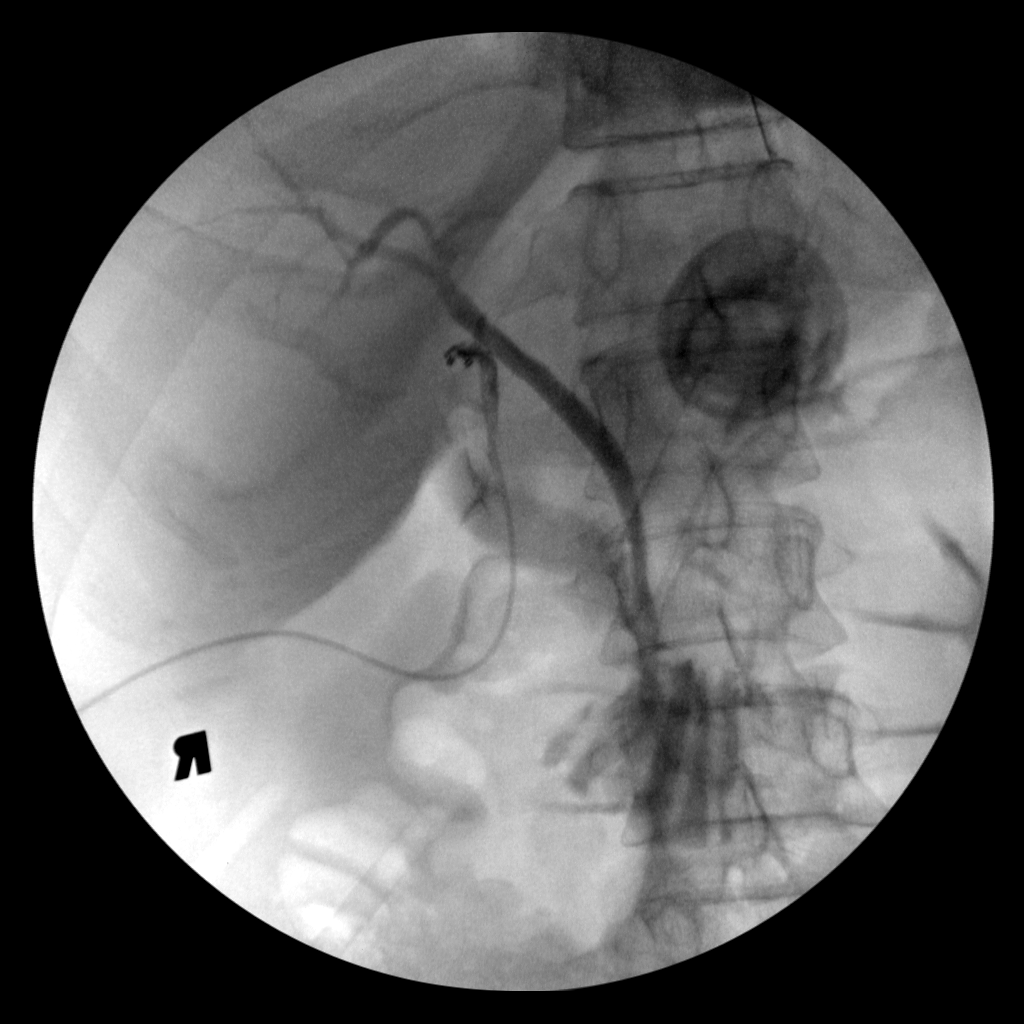

[4 of 4 positions shown; findings below may reference images not displayed]

FINDINGS: Intraoperative cholangiographic images of the right upper abdominal
quadrant during laparoscopic cholecystectomy are provided for
review.

Surgical clips overlie the expected location of the gallbladder
fossa.

Contrast injection demonstrates selective cannulation of the central
aspect of the cystic duct.

There is passage of contrast through the central aspect of the
cystic duct with filling of a non dilated common bile duct. There is
passage of contrast though the CBD and into the descending portion
of the duodenum.

There is minimal reflux of injected contrast into the common hepatic
duct and central aspect of the non dilated intrahepatic biliary
system.

There are no discrete filling defects within the opacified portions
of the biliary system to suggest the presence of
choledocholithiasis.
IMPRESSION: No evidence of choledocholithiasis.

## 2022-12-29 ENCOUNTER — Emergency Department (HOSPITAL_COMMUNITY)
Admission: EM | Admit: 2022-12-29 | Discharge: 2022-12-30 | Disposition: A | Discharge status: Home / Self Care | Payer: Self-pay | Attending: Emergency Medicine | Admitting: Emergency Medicine

## 2022-12-29 ENCOUNTER — Emergency Department (HOSPITAL_COMMUNITY): Payer: Self-pay

## 2022-12-29 ENCOUNTER — Other Ambulatory Visit: Payer: Self-pay

## 2022-12-29 DIAGNOSIS — S0183XA Puncture wound without foreign body of other part of head, initial encounter: Secondary | ICD-10-CM

## 2022-12-29 DIAGNOSIS — Z23 Encounter for immunization: Secondary | ICD-10-CM | POA: Insufficient documentation

## 2022-12-29 DIAGNOSIS — D72829 Elevated white blood cell count, unspecified: Secondary | ICD-10-CM | POA: Insufficient documentation

## 2022-12-29 DIAGNOSIS — S41032A Puncture wound without foreign body of left shoulder, initial encounter: Secondary | ICD-10-CM | POA: Insufficient documentation

## 2022-12-29 DIAGNOSIS — M542 Cervicalgia: Secondary | ICD-10-CM | POA: Insufficient documentation

## 2022-12-29 DIAGNOSIS — W3409XA Accidental discharge from other specified firearms, initial encounter: Secondary | ICD-10-CM | POA: Insufficient documentation

## 2022-12-29 DIAGNOSIS — F419 Anxiety disorder, unspecified: Secondary | ICD-10-CM | POA: Insufficient documentation

## 2022-12-29 LAB — CBC
HCT: 48.2 % (ref 39.0–52.0)
Hemoglobin: 16.8 g/dL (ref 13.0–17.0)
MCH: 32 pg (ref 26.0–34.0)
MCHC: 34.9 g/dL (ref 30.0–36.0)
MCV: 91.8 fL (ref 80.0–100.0)
Platelets: 240 10*3/uL (ref 150–400)
RBC: 5.25 MIL/uL (ref 4.22–5.81)
RDW: 13.5 % (ref 11.5–15.5)
WBC: 15.4 10*3/uL — ABNORMAL HIGH (ref 4.0–10.5)
nRBC: 0 % (ref 0.0–0.2)

## 2022-12-29 LAB — I-STAT CHEM 8, ED
BUN: 13 mg/dL (ref 6–20)
Calcium, Ion: 0.88 mmol/L — CL (ref 1.15–1.40)
Chloride: 108 mmol/L (ref 98–111)
Creatinine, Ser: 1 mg/dL (ref 0.61–1.24)
Glucose, Bld: 109 mg/dL — ABNORMAL HIGH (ref 70–99)
HCT: 49 % (ref 39.0–52.0)
Hemoglobin: 16.7 g/dL (ref 13.0–17.0)
Potassium: 6.3 mmol/L (ref 3.5–5.1)
Sodium: 134 mmol/L — ABNORMAL LOW (ref 135–145)
TCO2: 18 mmol/L — ABNORMAL LOW (ref 22–32)

## 2022-12-29 LAB — SAMPLE TO BLOOD BANK

## 2022-12-29 LAB — PROTIME-INR
INR: 1 (ref 0.8–1.2)
Prothrombin Time: 13.4 seconds (ref 11.4–15.2)

## 2022-12-29 LAB — LACTIC ACID, PLASMA: Lactic Acid, Venous: 4.4 mmol/L (ref 0.5–1.9)

## 2022-12-29 LAB — ETHANOL: Alcohol, Ethyl (B): 72 mg/dL — ABNORMAL HIGH (ref <10)

## 2022-12-29 MED ORDER — SODIUM CHLORIDE 0.9 % IV BOLUS (SEPSIS)
1000.0000 mL | Freq: Once | INTRAVENOUS | Status: AC
  Administered 2022-12-29: 1000 mL via INTRAVENOUS

## 2022-12-29 MED ORDER — CEFAZOLIN SODIUM-DEXTROSE 2-4 GM/100ML-% IV SOLN
2.0000 g | Freq: Once | INTRAVENOUS | Status: AC
  Administered 2022-12-29: 2 g via INTRAVENOUS
  Filled 2022-12-29: qty 100

## 2022-12-29 MED ORDER — TETANUS-DIPHTH-ACELL PERTUSSIS 5-2.5-18.5 LF-MCG/0.5 IM SUSY
0.5000 mL | PREFILLED_SYRINGE | Freq: Once | INTRAMUSCULAR | Status: AC
  Administered 2022-12-29: 0.5 mL via INTRAMUSCULAR
  Filled 2022-12-29: qty 0.5

## 2022-12-29 MED ORDER — IOHEXOL 350 MG/ML SOLN
75.0000 mL | Freq: Once | INTRAVENOUS | Status: AC | PRN
  Administered 2022-12-29: 75 mL via INTRAVENOUS

## 2022-12-29 NOTE — ED Notes (Addendum)
MD aware of abnormal chem-8 results. Per Dr. Jeraldine Loots, will wait on CMP to result back for accuracy.

## 2022-12-29 NOTE — ED Notes (Signed)
MD Janee Morn removed c collar at this time. C Spine cleared.
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
## 2022-12-29 NOTE — ED Notes (Signed)
Patient transported to CT 

## 2022-12-29 NOTE — ED Notes (Signed)
Patient returned from CT at this time.
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
## 2022-12-30 DIAGNOSIS — S0183XA Puncture wound without foreign body of other part of head, initial encounter: Secondary | ICD-10-CM

## 2022-12-30 DIAGNOSIS — S0240DA Maxillary fracture, left side, initial encounter for closed fracture: Secondary | ICD-10-CM

## 2022-12-30 DIAGNOSIS — S0180XA Unspecified open wound of other part of head, initial encounter: Secondary | ICD-10-CM

## 2022-12-30 DIAGNOSIS — W3400XA Accidental discharge from unspecified firearms or gun, initial encounter: Secondary | ICD-10-CM

## 2022-12-30 LAB — COMPREHENSIVE METABOLIC PANEL
ALT: 23 U/L (ref 0–44)
AST: 29 U/L (ref 15–41)
Albumin: 3.1 g/dL — ABNORMAL LOW (ref 3.5–5.0)
Alkaline Phosphatase: 88 U/L (ref 38–126)
Anion gap: 15 (ref 5–15)
BUN: 9 mg/dL (ref 6–20)
CO2: 17 mmol/L — ABNORMAL LOW (ref 22–32)
Calcium: 8 mg/dL — ABNORMAL LOW (ref 8.9–10.3)
Chloride: 105 mmol/L (ref 98–111)
Creatinine, Ser: 0.96 mg/dL (ref 0.61–1.24)
GFR, Estimated: >60 mL/min (ref >60)
Glucose, Bld: 122 mg/dL — ABNORMAL HIGH (ref 70–99)
Potassium: 2.9 mmol/L — ABNORMAL LOW (ref 3.5–5.1)
Sodium: 137 mmol/L (ref 135–145)
Total Bilirubin: 0.4 mg/dL (ref 0.3–1.2)
Total Protein: 5.9 g/dL — ABNORMAL LOW (ref 6.5–8.1)

## 2022-12-30 MED ORDER — BACITRACIN ZINC 500 UNIT/GM EX OINT
TOPICAL_OINTMENT | Freq: Two times a day (BID) | CUTANEOUS | Status: DC
  Administered 2022-12-30: 2 via TOPICAL
  Filled 2022-12-30: qty 1.8

## 2022-12-30 MED ORDER — CEPHALEXIN 500 MG PO CAPS: 500.0000 mg | ORAL_CAPSULE | Freq: Two times a day (BID) | ORAL | 0 refills | Status: AC

## 2022-12-30 MED ORDER — POTASSIUM CHLORIDE CRYS ER 20 MEQ PO TBCR
40.0000 meq | EXTENDED_RELEASE_TABLET | Freq: Once | ORAL | Status: AC
  Administered 2022-12-30: 40 meq via ORAL
  Filled 2022-12-30: qty 2

## 2022-12-30 NOTE — ED Notes (Signed)
Per [facility], patient able to keep house keys, wallet and $13.00 cash (2- $5.00, 3- $1.00)
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
## 2022-12-30 NOTE — ED Notes (Signed)
Patient attempting to call for a ride
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
